# Patient Record
Sex: Female | Born: 1940 | Race: White | Hispanic: No | Marital: Married
Health system: Southern US, Community
[De-identification: ages and names within clinical notes are randomized; demographics above are authoritative.]

---

## 1997-10-07 ENCOUNTER — Other Ambulatory Visit: Admission: RE | Admit: 1997-10-07 | Discharge: 1997-10-07 | Payer: Self-pay | Admitting: Obstetrics and Gynecology

## 1998-09-30 ENCOUNTER — Ambulatory Visit (HOSPITAL_COMMUNITY): Admission: RE | Admit: 1998-09-30 | Discharge: 1998-09-30 | Payer: Self-pay | Admitting: Gastroenterology

## 1998-10-22 ENCOUNTER — Encounter: Admission: RE | Admit: 1998-10-22 | Discharge: 1998-10-22 | Payer: Self-pay | Admitting: Gastroenterology

## 1998-11-17 ENCOUNTER — Other Ambulatory Visit: Admission: RE | Admit: 1998-11-17 | Discharge: 1998-11-17 | Payer: Self-pay | Admitting: *Deleted

## 2000-02-03 ENCOUNTER — Other Ambulatory Visit: Admission: RE | Admit: 2000-02-03 | Discharge: 2000-02-03 | Payer: Self-pay | Admitting: *Deleted

## 2001-02-06 ENCOUNTER — Other Ambulatory Visit: Admission: RE | Admit: 2001-02-06 | Discharge: 2001-02-06 | Payer: Self-pay | Admitting: *Deleted

## 2003-02-13 ENCOUNTER — Other Ambulatory Visit: Admission: RE | Admit: 2003-02-13 | Discharge: 2003-02-13 | Payer: Self-pay | Admitting: Gastroenterology

## 2003-05-07 ENCOUNTER — Ambulatory Visit (HOSPITAL_COMMUNITY): Admission: RE | Admit: 2003-05-07 | Discharge: 2003-05-07 | Payer: Self-pay | Admitting: Gastroenterology

## 2004-06-09 ENCOUNTER — Other Ambulatory Visit: Admission: RE | Admit: 2004-06-09 | Discharge: 2004-06-09 | Payer: Self-pay | Admitting: Obstetrics and Gynecology

## 2004-06-18 ENCOUNTER — Encounter: Admission: RE | Admit: 2004-06-18 | Discharge: 2004-06-18 | Payer: Self-pay | Admitting: Obstetrics and Gynecology

## 2005-06-17 ENCOUNTER — Other Ambulatory Visit: Admission: RE | Admit: 2005-06-17 | Discharge: 2005-06-17 | Payer: Self-pay | Admitting: Obstetrics and Gynecology

## 2006-06-02 ENCOUNTER — Encounter: Admission: RE | Admit: 2006-06-02 | Discharge: 2006-06-02 | Payer: Self-pay | Admitting: Family Medicine

## 2006-07-20 ENCOUNTER — Other Ambulatory Visit: Admission: RE | Admit: 2006-07-20 | Discharge: 2006-07-20 | Payer: Self-pay | Admitting: Family Medicine

## 2007-11-22 ENCOUNTER — Encounter: Admission: RE | Admit: 2007-11-22 | Discharge: 2007-11-22 | Payer: Self-pay | Admitting: Family Medicine

## 2008-01-25 ENCOUNTER — Encounter (INDEPENDENT_AMBULATORY_CARE_PROVIDER_SITE_OTHER): Payer: Self-pay | Admitting: Obstetrics and Gynecology

## 2008-01-25 ENCOUNTER — Inpatient Hospital Stay (HOSPITAL_COMMUNITY): Admission: AD | Admit: 2008-01-25 | Discharge: 2008-01-26 | Payer: Self-pay | Admitting: Obstetrics and Gynecology

## 2008-05-07 ENCOUNTER — Encounter: Admission: RE | Admit: 2008-05-07 | Discharge: 2008-05-07 | Payer: Self-pay | Admitting: Family Medicine

## 2008-12-18 ENCOUNTER — Observation Stay (HOSPITAL_COMMUNITY): Admission: EM | Admit: 2008-12-18 | Discharge: 2008-12-19 | Payer: Self-pay | Admitting: Emergency Medicine

## 2008-12-18 ENCOUNTER — Ambulatory Visit: Payer: Self-pay | Admitting: Cardiology

## 2009-01-06 ENCOUNTER — Encounter: Admission: RE | Admit: 2009-01-06 | Discharge: 2009-01-06 | Payer: Self-pay | Admitting: Gastroenterology

## 2009-06-05 ENCOUNTER — Encounter: Admission: RE | Admit: 2009-06-05 | Discharge: 2009-06-05 | Payer: Self-pay | Admitting: Family Medicine

## 2010-04-14 LAB — CBC
Hemoglobin: 13.2 g/dL (ref 12.0–15.0)
MCV: 94.6 fL (ref 78.0–100.0)
Platelets: 308 10*3/uL (ref 150–400)
RBC: 4.01 MIL/uL (ref 3.87–5.11)
RDW: 12.7 % (ref 11.5–15.5)

## 2010-04-14 LAB — BASIC METABOLIC PANEL
CO2: 26 mEq/L (ref 19–32)
Chloride: 107 mEq/L (ref 96–112)
Creatinine, Ser: 0.89 mg/dL (ref 0.4–1.2)
GFR calc Af Amer: >60 mL/min (ref >60)
Potassium: 3.5 mEq/L (ref 3.5–5.1)

## 2010-04-14 LAB — DIFFERENTIAL
Basophils Absolute: 0.1 10*3/uL (ref 0.0–0.1)
Basophils Relative: 1 % (ref 0–1)
Lymphs Abs: 2.3 10*3/uL (ref 0.7–4.0)
Neutro Abs: 3.5 10*3/uL (ref 1.7–7.7)

## 2010-04-14 LAB — LIPID PANEL
Cholesterol: 154 mg/dL (ref 0–200)
HDL: 70 mg/dL (ref >39)
Total CHOL/HDL Ratio: 2.2 RATIO
Triglycerides: 49 mg/dL (ref <150)

## 2010-04-14 LAB — POCT CARDIAC MARKERS: CKMB, poc: 1.2 ng/mL (ref 1.0–8.0)

## 2010-04-14 LAB — CK TOTAL AND CKMB (NOT AT ARMC)
Relative Index: INVALID (ref 0.0–2.5)
Total CK: 55 U/L (ref 7–177)

## 2010-04-14 LAB — TROPONIN I: Troponin I: 0.01 ng/mL (ref 0.00–0.06)

## 2010-04-14 LAB — CARDIAC PANEL(CRET KIN+CKTOT+MB+TROPI): Relative Index: INVALID (ref 0.0–2.5)

## 2010-04-27 LAB — BASIC METABOLIC PANEL
BUN: 17 mg/dL (ref 6–23)
Calcium: 9.8 mg/dL (ref 8.4–10.5)
Chloride: 103 mEq/L (ref 96–112)
Creatinine, Ser: 0.83 mg/dL (ref 0.4–1.2)
Glucose, Bld: 91 mg/dL (ref 70–99)
Potassium: 4 mEq/L (ref 3.5–5.1)
Sodium: 138 mEq/L (ref 135–145)

## 2010-04-27 LAB — CBC
HCT: 39.5 % (ref 36.0–46.0)
MCHC: 33.6 g/dL (ref 30.0–36.0)
MCHC: 33.7 g/dL (ref 30.0–36.0)
MCV: 95.1 fL (ref 78.0–100.0)
Platelets: 246 10*3/uL (ref 150–400)
RDW: 12.8 % (ref 11.5–15.5)
WBC: 5.1 10*3/uL (ref 4.0–10.5)

## 2010-04-29 ENCOUNTER — Other Ambulatory Visit: Payer: Self-pay | Admitting: Family Medicine

## 2010-04-29 DIAGNOSIS — Z1231 Encounter for screening mammogram for malignant neoplasm of breast: Secondary | ICD-10-CM

## 2010-05-26 NOTE — Op Note (Signed)
Sarah Osborn, Sarah Osborn              ACCOUNT NO.:  [id number]   MEDICAL RECORD NO.:  [id number]          PATIENT TYPE:  INP   LOCATION:  1411                         FACILITY:  [facility]   PHYSICIAN:  Michelle L. Grewal, M.D.DATE OF BIRTH:  01/21/1940   DATE OF PROCEDURE:  01/25/2008  DATE OF DISCHARGE:                               OPERATIVE REPORT   PREOPERATIVE DIAGNOSES:  Uterine prolapse, desires oophorectomy,  cystocele, and rectocele.   POSTOPERATIVE DIAGNOSIS:  Uterine prolapse, desires oophorectomy,  cystocele, and rectocele.   PROCEDURE:  LAVH and BSO.   SURGEON:  Michelle L. Vincente Poli, MD   ASSISTANT:  Miguel Aschoff, MD   ANESTHESIA:  General.   ESTIMATED BLOOD LOSS:  Less than 200 mL.   PROCEDURE IN DETAIL:  The patient was taken to the operating room #12  after informed consent was obtained.  She was prepped and draped in  usual sterile fashion after she was intubated.  Exam under anesthesia  revealed a grade II to III cervical uterine prolapse and cystocele and  rectocele.  She was prepped and draped and a Foley catheter was  inserted.  The uterine manipulator was inserted.  Attention was turned  to the abdomen.  A small infraumbilical incision was made and the Veress  needle was inserted.  It was inserted in standard fashion.  The Veress  needle was removed after pneumoperitoneum was achieved and an 11-mm  trocar was inserted.  The laparoscope was introduced through the trocar  sheath.  Under direct visualization, a 5-mm trocar was inserted  suprapubically and a second 5-mm trocar was inserted in the left lower  quadrant.  Both were inserted under direct visualization.  Exam of the  abdomen and pelvis revealed the following:  The ovaries appeared normal  and very small in atrophy.  They were easily visualized.  The uterus was  small, no endometriosis, no adhesions were noted.  The ureters were  identified easily.  Using an atraumatic grasper, the right ovary was  then  grasped and elevated and using the Gyrus instrument, the Gyrus was  placed just beneath the ovary with careful attention to stay away from  the ureter and the pedicle was burned and carried down to the round  ligament without any difficulty.  This was done identically on the left  side.  We noted bilateral ureteral peristalsis after this was done.  At  this point, the pneumoperitoneum was released and then a weighted  speculum was placed in the vagina.  A circumferential incision was made  around the cervix.  The posterior cul-de-sac was entered sharply using  Mayo scissors.  The anterior cul-de-sac was entered sharply using  Metzenbaum scissors.  Using curved Heaney clamp, the uterosacral  cardinal ligaments were clamped on either side.  Each pedicle was  clamped and suture ligated using 0 Vicryl suture.  We walked our way up  the broad ligament staying very close next to the cervix and uterus.  Each pedicle was secured using a suture ligature of 0 Vicryl suture.  Once we reached the level of the fundus, the uterus was retroflexed.  The remainder of the broad ligament was clamped on either side.  The  specimens were removed.  The pedicles were secured using a suture  ligature of 0 Vicryl suture.  The posterior cuff was closed from 3-9  o'clock in a running locked stitch using 0 Vicryl suture.  The cuff was  then closed completely using 0 Vicryl suture in a running stitch,  anterior to posterior.  Hemostasis was excellent.  At this point, Dr.  Patsi Sears scrubbed in and he will perform a cystoscopy and an uphold  vaginal LEEP procedure.  I did close the abdominal incisions using  Dermabond skin adhesive.  There were dry.  All sponge, lap, and  instrument counts were correct x2 when I scrubbed out.  The remainder of  the procedure will be dictated by Dr. Patsi Sears.   PATHOLOGY:  Cervix, uterus, tubes, and ovaries.   DRAINS:  Foley and vaginal packing.   COMPLICATIONS:  None.       Michelle L. Vincente Poli, M.D.  Electronically Signed     MLG/MEDQ  D:  01/25/2008  T:  01/26/2008  Job:  [id number]

## 2010-05-26 NOTE — Op Note (Signed)
NAMEGREIDY, Sarah Osborn              ACCOUNT NO.:  [id number]   MEDICAL RECORD NO.:  [id number]          PATIENT TYPE:  INP   LOCATION:  1411                         FACILITY:  [facility]   PHYSICIAN:  Sigmund I. Patsi Sears, M.D.DATE OF BIRTH:  [birth year]   DATE OF PROCEDURE:  01/25/2008  DATE OF DISCHARGE:                               OPERATIVE REPORT   PREOPERATIVE DIAGNOSIS:  Pelvic floor prolapse.   POSTOPERATIVE DIAGNOSIS:  Pelvic floor prolapse.   OPERATION:  Laparoscopic assisted vaginal hysterectomy (Dr. Vincente Poli),  with coincidental anterior vaginal vault repair with apical fixation and  sacrospinous fixation using Uphold mesh [facility]), with  cystoscopy and ureteral stenting.   SURGEON:  Sigmund I. Patsi Sears, M.D.   ANESTHESIA:  General endotracheal.   PROCEDURE IN DETAIL:  After appropriate preanesthesia, the patient is  brought to the operating room and placed on the operating room in dorsal  supine position where general endotracheal anesthesia was induced.  The  patient was then placed in lithotomy position.  She underwent  laparoscopic assisted vaginal hysterectomy per Dr. Vincente Poli, this will be  dictated separately.  Following this the patient's vagina was irrigated.  No bleeding was noted.  Using a cystoscope the bladder was evaluated and  found to have some tenting at the base of the bladder, but ureteral  orifices were identified.  Indigo carmine was given and indigo carmine  was seen from both ureters.  Bilateral 6 [place] open-ended catheters  were then passed to the level of the kidneys and Foley catheter was  placed through the duration of the case.  It was noted at the end of the  case that the ureteral stents were removed and Foley catheter remained  in place.   Marcaine 0.25% with epinephrine 1:200,000 was then injected along a line  of incision which was semilunar, running from the sulcus on the right  side, across the midline to the sulcus on the  left side.  Incision was  made.  Subcutaneous tissue was dissected with sharp and blunt  dissection, identifying the cystocele.  No bleeding was noted.  The apex  was then dissected bilaterally and the ischial spines were palpable  bilaterally.  A small Kelly plication was then accomplished with running  3-0 Vicryl suture.  Following this the ischial spines were again  identified with the sacrospinous ligament palpable.  Approximately 1-1/2  fingerbreadths proximal to the ischial spines, the Capio device was used  to place the [employer] limb.  Feeling the right ischial  spine the sacrospinous ligament was identified and, again, the Uphold  device was used to pass the suture for the Uphold mesh.  No bleeding was  noted.  The Uphold was then sutured with three separate 3-0 Vicryl  sutures around the vaginal cuff.  The device was then manipulated into  position and three more sutures were used to tack the top portion of the  Uphold mesh in  place.  The wounds were then cut and sutures removed.  The wound was  closed with two separate 3-0 Vicryl sutures.  Vaginal packing was placed  with Estrace  cream and the patient was awakened and taken to the  recovery room in good condition.  B and O suppository was given.      Sigmund I. Patsi Sears, M.D.  Electronically Signed     SIT/MEDQ  D:  01/25/2008  T:  01/25/2008  Job:  [id number]   cc:   Marcelino Duster L. Vincente Poli, M.D.  Fax: [phone]

## 2010-05-26 NOTE — H&P (Signed)
Sarah Osborn, Sarah Osborn              ACCOUNT NO.:  [id number]   MEDICAL RECORD NO.:  [id number]          PATIENT TYPE:  INP   LOCATION:  NA                           FACILITY:  [facility]   PHYSICIAN:  Michelle L. Grewal, M.D.DATE OF BIRTH:  May 30, 1940   DATE OF ADMISSION:  DATE OF DISCHARGE:                              HISTORY & PHYSICAL   HISTORY OF PRESENT ILLNESS:  This is a 70 year old female with pelvic  relaxation.  She is a gravida 2, para 2, with children with ages of 72  and 54.  She complains of constant, increasing vaginal heaviness and  pressure.  She had denied stress incontinence.  She does note urinary  frequency and difficulty postponing urination, nocturia and feeling of  overactive bladder.  She has no trouble starting her stream.  She  sometimes feels a bulge in her vagina.   PAST MEDICAL HISTORY:  History of arthritis and hypothyroidism.   PAST SURGICAL HISTORY:  History of appendectomy, history of breast  biopsy during breast surgery.   CURRENT MEDICATIONS:  Calcium, glucosamine chondroitin sulfate,  Metamucil, multivitamin, stool softener, Synthroid 100 mcg a day,  Tylenol Arthritis and vitamin B vitamins.   ALLERGIES:  CODEINE, PENICILLIN, and SULFA.   FAMILY HISTORY:  She has a family history of COPD, family history of  lung cancer and family history of prostate cancer.   SOCIAL HISTORY:  She has previous history of smoking, smoked for 5  years, quit 35 years ago.  She is currently married.   REVIEW OF SYSTEMS:  Unremarkable.   PHYSICAL EXAMINATION:  VITAL SIGNS:  Height 5 feet 4 inches, weight 139  pounds, BP 110/70.  LUNGS:  Clear to auscultation bilaterally.  CARDIAC:  Regular rate and rhythm.  BREASTS:  Soft, nontender, no masses.  PELVIC:  External genitalia within normal limits.  Vagina:  Cystocele  noted, uterine prolapse noted and rectocele noted.  No adnexal masses.   IMPRESSION:  Pelvic relaxation.   PLAN:  Patient does desire ovaries to be  removed.  Will proceed with  LAVH and BSO.  Dr. Tenny Craw will be my assistant.  Dr. Patsi Sears to follow  with [facility] Vaginal Apex Procedure.  Risks and benefits were reviewed  with the patient, and we will proceed with surgery.      Michelle L. Vincente Poli, M.D.  Electronically Signed     MLG/MEDQ  D:  01/22/2008  T:  01/22/2008  Job:  [id number]

## 2010-05-29 NOTE — Discharge Summary (Signed)
NAMESAN, RUA              ACCOUNT NO.:  [id number]   MEDICAL RECORD NO.:  [id number]          PATIENT TYPE:  INP   LOCATION:  1411                         FACILITY:  [facility]   PHYSICIAN:  Sigmund I. Patsi Sears, M.D.DATE OF BIRTH:  March 28, 1940   DATE OF ADMISSION:  01/25/2008  DATE OF DISCHARGE:  01/26/2008                               DISCHARGE SUMMARY   OPERATIONS THIS ADMISSION:  1. Took place on January 25, 2008 operation laparoscopic-assisted      vaginal hysterectomy and bilateral salpingo-oophorectomy (Dr.      Vincente Poli and Dr. Tenny Craw).  2. Took place on January 25, 2008 the operation is cystoscopy,      bilateral ureteral stents with anterior vault repair.   HISTORY:  Ms. Sobocinski is a 69-year female with a history of pelvic  relaxation.  She is gravida 2, para 2, and has constant increasing  vaginal heaviness and pressure.  She denies stress incontinence, but  does note to have a large vault relaxation, with apical release.  She is  admitted via the OR for anterior wall repair, with LAVH and BSO.   PHYSICAL EXAMINATION:  As noted in dictated H and P of January 26, 2008.   PAST HISTORY:  Arthritis and thyroid disease.   PAST SURGERIES:  Appendectomy, breast biopsy.   MEDICATIONS:  Included calcium, glucosamine, Metamucil, multivitamins,  stool softener, and Synthroid.   ALLERGIES:  1. CODEINE.  2. PENICILLIN.  3. SULFA.   FAMILY HISTORY:  Significant for sister with breast cancer.   SOCIAL:  The patient is married.  She has a 5-pack-year history of  smoking, none x35 years.   REVIEW OF SYSTEMS:  Significant for a pelvic floor pressure and pain,  prolapsed vaginal tissue.  She has some urinary frequency and urgency.   HOSPITAL COURSE:  On the day of admission, the patient underwent  laparoscopic-assisted vaginal hysterectomy, cystoscopy, bilateral  retrograde, and uphold vault prolapse repair.  Postoperatively, the  patient did well, was  followed in the  hospital.  She remained in the hospital until she was  allowed to be discharged January 12, 2008.  She is discharged in stable  condition.  She will return to see Dr. Vincente Poli for followup as per office  notes and Dr. Patsi Sears as per office notes.      Sigmund I. Patsi Sears, M.D.  Electronically Signed     SIT/MEDQ  D:  03/06/2008  T:  03/06/2008  Job:  [id number]   cc:   Marcelino Duster L. Vincente Poli, M.D.  Fax: [phone]

## 2010-06-09 ENCOUNTER — Ambulatory Visit
Admission: RE | Admit: 2010-06-09 | Discharge: 2010-06-09 | Disposition: A | Discharge status: Home / Self Care | Payer: Medicare Other | Source: Ambulatory Visit | Attending: Family Medicine | Admitting: Family Medicine

## 2010-06-09 DIAGNOSIS — Z1231 Encounter for screening mammogram for malignant neoplasm of breast: Secondary | ICD-10-CM

## 2011-06-02 ENCOUNTER — Other Ambulatory Visit: Payer: Self-pay | Admitting: Family Medicine

## 2011-06-02 DIAGNOSIS — Z1231 Encounter for screening mammogram for malignant neoplasm of breast: Secondary | ICD-10-CM

## 2011-08-10 ENCOUNTER — Ambulatory Visit: Payer: Medicare Other

## 2011-08-10 ENCOUNTER — Ambulatory Visit
Admission: RE | Admit: 2011-08-10 | Discharge: 2011-08-10 | Disposition: A | Discharge status: Home / Self Care | Payer: Medicare Other | Source: Ambulatory Visit | Attending: Family Medicine | Admitting: Family Medicine

## 2011-08-10 DIAGNOSIS — Z1231 Encounter for screening mammogram for malignant neoplasm of breast: Secondary | ICD-10-CM

## 2012-07-26 ENCOUNTER — Other Ambulatory Visit: Payer: Self-pay

## 2012-07-26 DIAGNOSIS — Z1231 Encounter for screening mammogram for malignant neoplasm of breast: Secondary | ICD-10-CM

## 2012-09-21 ENCOUNTER — Ambulatory Visit
Admission: RE | Admit: 2012-09-21 | Discharge: 2012-09-21 | Disposition: A | Discharge status: Home / Self Care | Payer: Self-pay | Source: Ambulatory Visit

## 2012-09-21 DIAGNOSIS — Z1231 Encounter for screening mammogram for malignant neoplasm of breast: Secondary | ICD-10-CM

## 2012-10-04 ENCOUNTER — Ambulatory Visit: Payer: Medicare Other

## 2013-02-14 ENCOUNTER — Other Ambulatory Visit: Payer: Self-pay | Admitting: Gastroenterology

## 2013-02-14 DIAGNOSIS — R1032 Left lower quadrant pain: Secondary | ICD-10-CM

## 2013-02-21 ENCOUNTER — Ambulatory Visit
Admission: RE | Admit: 2013-02-21 | Discharge: 2013-02-21 | Disposition: A | Discharge status: Home / Self Care | Payer: Self-pay | Source: Ambulatory Visit | Attending: Gastroenterology | Admitting: Gastroenterology

## 2013-02-21 ENCOUNTER — Inpatient Hospital Stay: Admission: RE | Admit: 2013-02-21 | Payer: Medicare Other | Source: Ambulatory Visit

## 2013-02-21 MED ORDER — IOHEXOL 300 MG/ML  SOLN
100.0000 mL | Freq: Once | INTRAMUSCULAR | Status: AC | PRN
  Administered 2013-02-21: 100 mL via INTRAVENOUS

## 2013-03-09 ENCOUNTER — Other Ambulatory Visit: Payer: Medicare Other

## 2013-08-28 ENCOUNTER — Other Ambulatory Visit: Payer: Self-pay

## 2013-08-28 DIAGNOSIS — Z1231 Encounter for screening mammogram for malignant neoplasm of breast: Secondary | ICD-10-CM

## 2013-10-18 ENCOUNTER — Ambulatory Visit
Admission: RE | Admit: 2013-10-18 | Discharge: 2013-10-18 | Disposition: A | Discharge status: Home / Self Care | Payer: 59 | Source: Ambulatory Visit

## 2013-10-18 ENCOUNTER — Ambulatory Visit: Payer: 59

## 2013-10-18 ENCOUNTER — Encounter (INDEPENDENT_AMBULATORY_CARE_PROVIDER_SITE_OTHER): Payer: Self-pay

## 2013-10-18 DIAGNOSIS — Z1231 Encounter for screening mammogram for malignant neoplasm of breast: Secondary | ICD-10-CM

## 2016-02-04 IMAGING — CT CT ABD-PELV W/ CM
3 of 5 series · 13 of 36 positions shown, 19 images · IV contrast (READICAT/WATER & [ID] OMNI 300)
Comparison: 09/26/2012

CLINICAL DATA: Left lower quadrant abdominal pain, possible mass
effect on sigmoid colon on colonoscopy, evaluate for submucosal mass

EXAM:
CT ABDOMEN AND PELVIS WITH CONTRAST
TECHNIQUE: Multidetector CT imaging of the abdomen and pelvis was performed
using the standard protocol following bolus administration of
intravenous contrast.
CONTRAST:  100mL OMNIPAQUE IOHEXOL 300 MG/ML  SOLN

[Series 3: abd/pelvis with · axial · 0.85mm/px · z∈[-388,-58]mm · 7 of 87 slices shown, 12 images]
[im 11/87  soft-tissue]
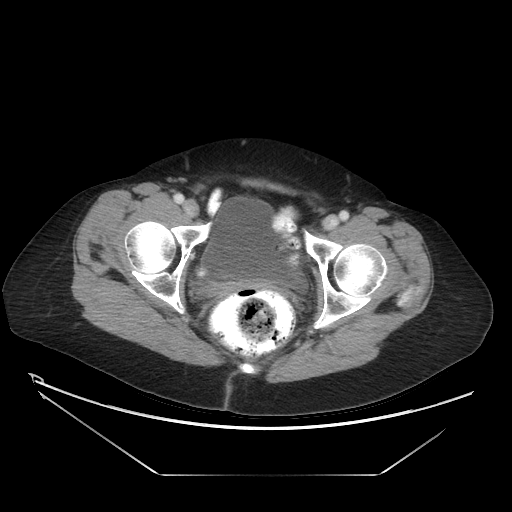
[im 11/87  bone]
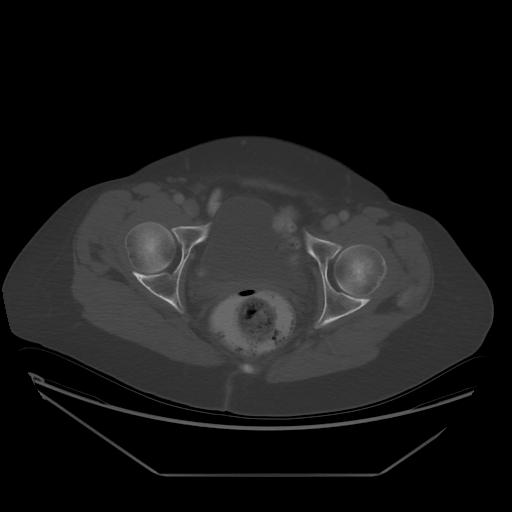
[im 22/87  soft-tissue]
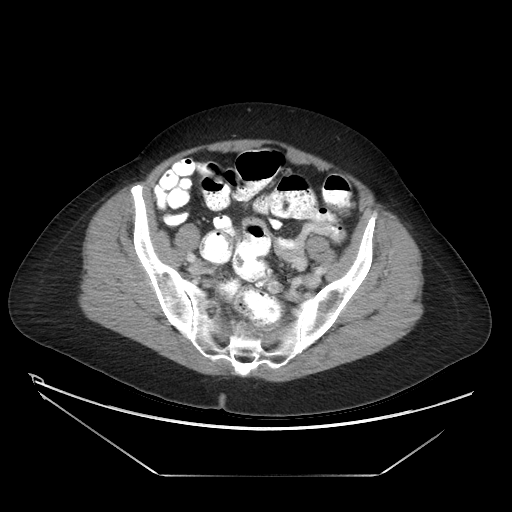
[im 33/87  soft-tissue]
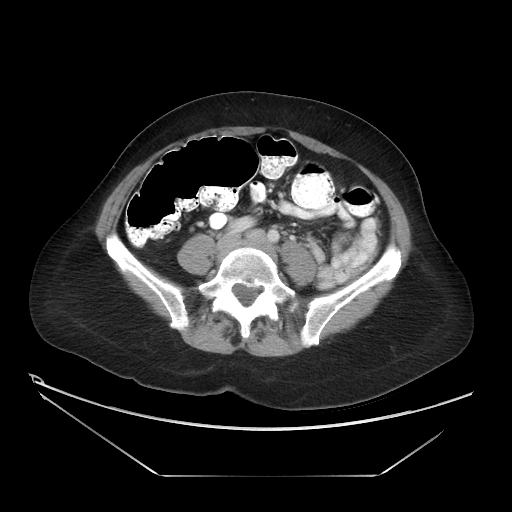
[im 44/87  soft-tissue]
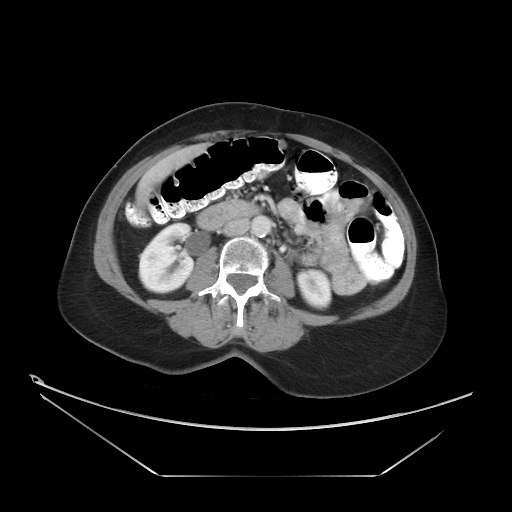
[im 44/87  lung]
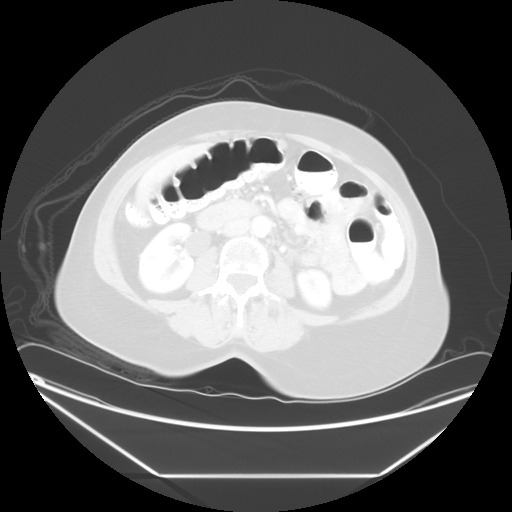
[im 54/87  soft-tissue]
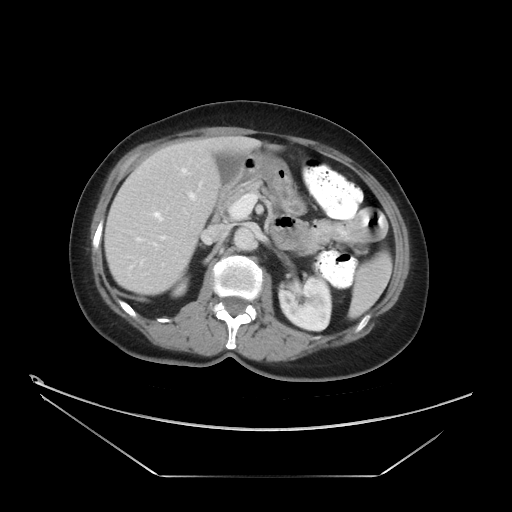
[im 54/87  lung]
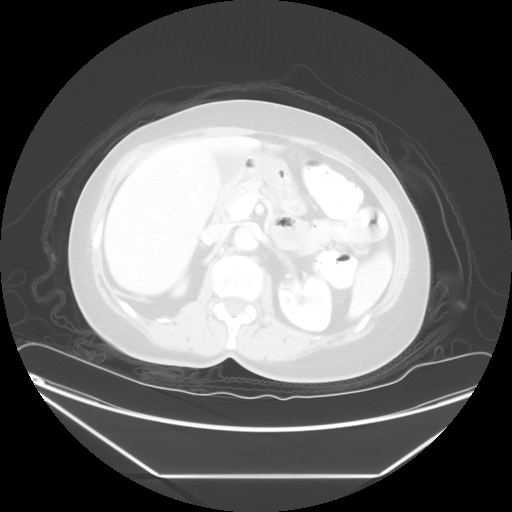
[im 65/87  soft-tissue]
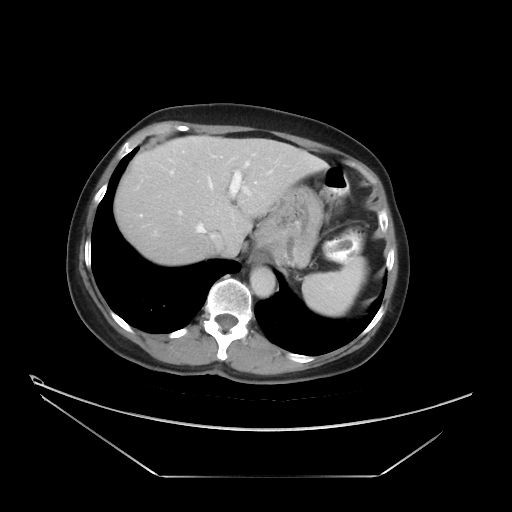
[im 65/87  lung]
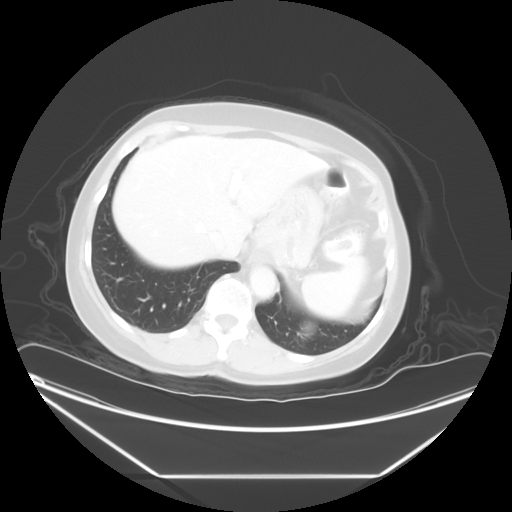
[im 76/87  soft-tissue]
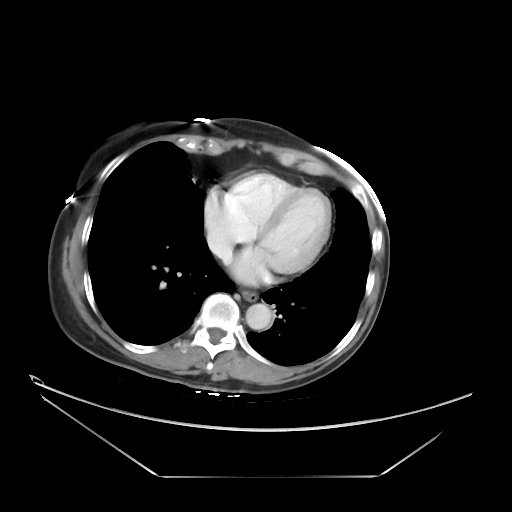
[im 76/87  lung]
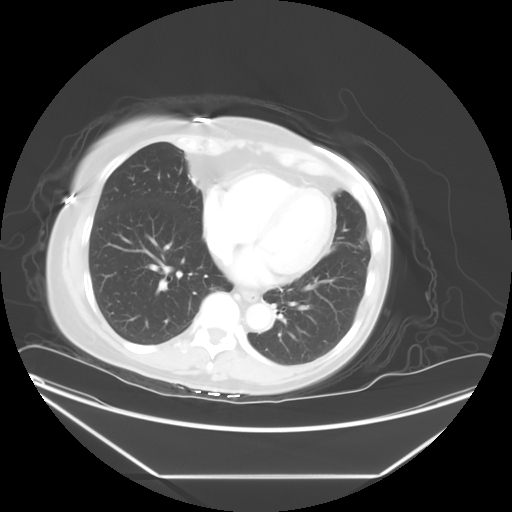

[Series 601: coronal body · coronal · 0.87mm/px · 1 of 113 slices shown, 2 images]
[im 38/113  soft-tissue]
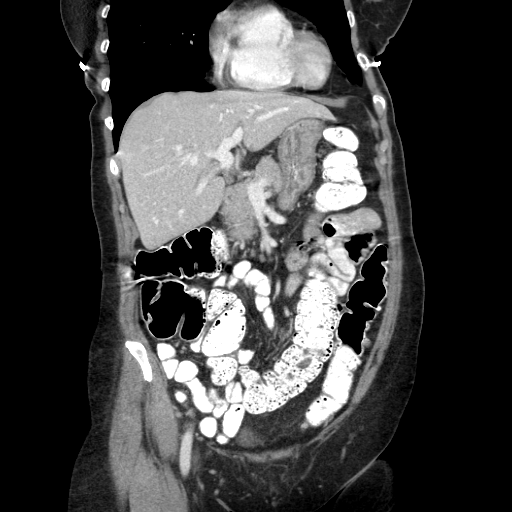
[im 38/113  bone]
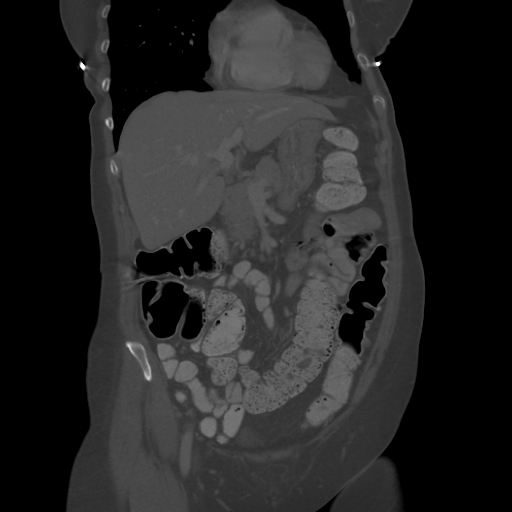

[Series 602: sagittal body · sagittal · 0.87mm/px · 5 of 170 slices shown]
[im 11/170  soft-tissue]
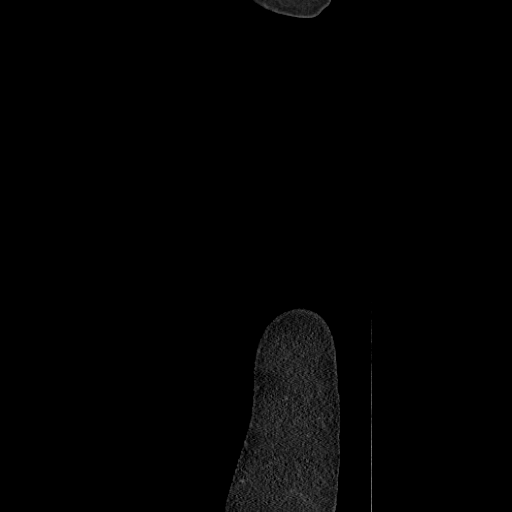
[im 32/170  soft-tissue]
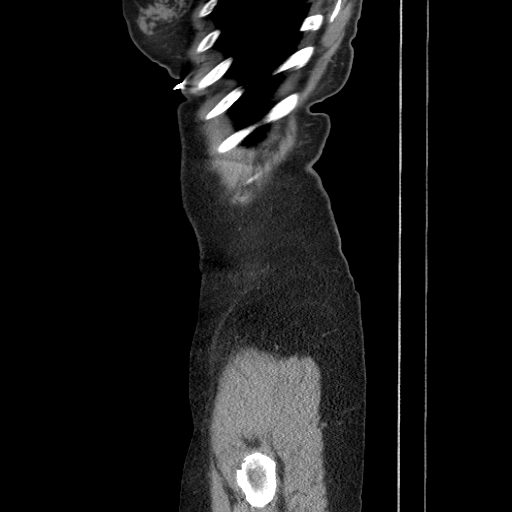
[im 53/170  soft-tissue]
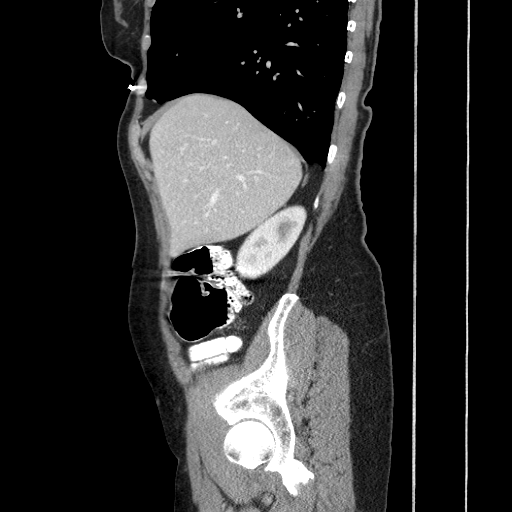
[im 74/170  soft-tissue]
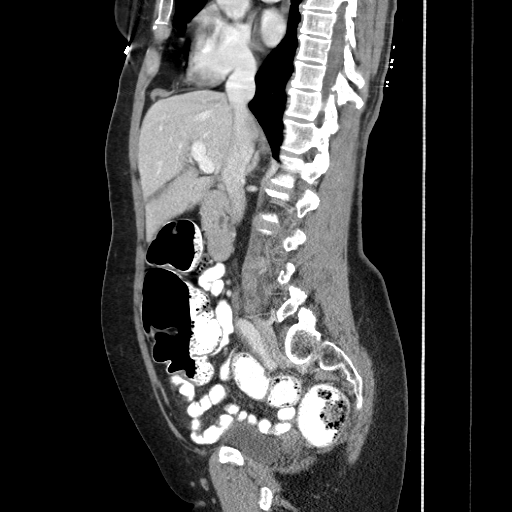
[im 96/170  soft-tissue]
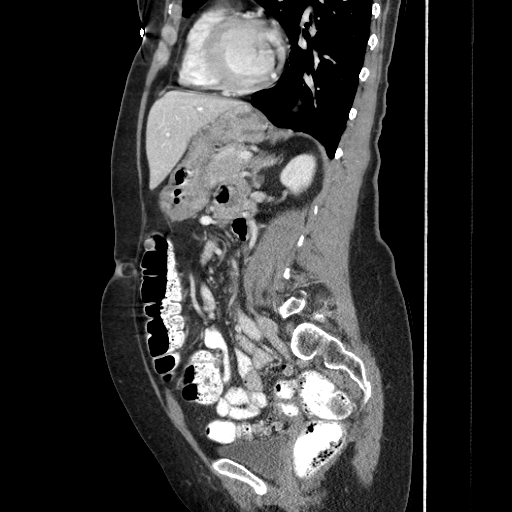

[13 of 36 positions shown; findings below may reference images not displayed]

FINDINGS: Lung bases are clear.

Liver, spleen, pancreas, and adrenal glands are within normal
limits.

Gallbladder is unremarkable. No intrahepatic or extrahepatic ductal
dilatation.

Kidneys are within normal limits.  No hydronephrosis.

Oral and rectal contrast was administered. No evidence of bowel
obstruction. Diverticulosis involving the descending/sigmoid colon,
without associated inflammatory changes. Redundant sigmoid colon
without visualized colonic or extrinsic mass.

Atherosclerotic calcifications of the abdominal aorta and branch
vessels.

No abdominopelvic ascites.

No suspicious abdominopelvic lymphadenopathy.

Status post hysterectomy.  No adnexal masses.

Bladder is within normal limits.

Mild degenerative changes of the visualized thoracolumbar spine.
Grade 1 anterolisthesis of L4 on L5.
IMPRESSION: Colonic diverticulosis, without evidence of diverticulitis.

Redundant sigmoid colon without visualized colonic or extrinsic mass
by CT.
